# Patient Record
Sex: Female | Born: 2000 | Race: White | Hispanic: No | Marital: Single | Smoking: Never smoker
Health system: Southern US, Community
[De-identification: ages and names within clinical notes are randomized; demographics above are authoritative.]

## PROBLEM LIST (undated history)

## (undated) DIAGNOSIS — F845 Asperger's syndrome: Secondary | ICD-10-CM

---
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
## 2018-03-29 IMAGING — DX DG TIBIA/FIBULA 2V*L*
4 series · 4 of 4 positions shown · non-contrast
Comparison: None.

CLINICAL DATA: Leg pain and swelling

EXAM:
LEFT TIBIA AND FIBULA - 2 VIEW

[tibia ap (1 of 2)]
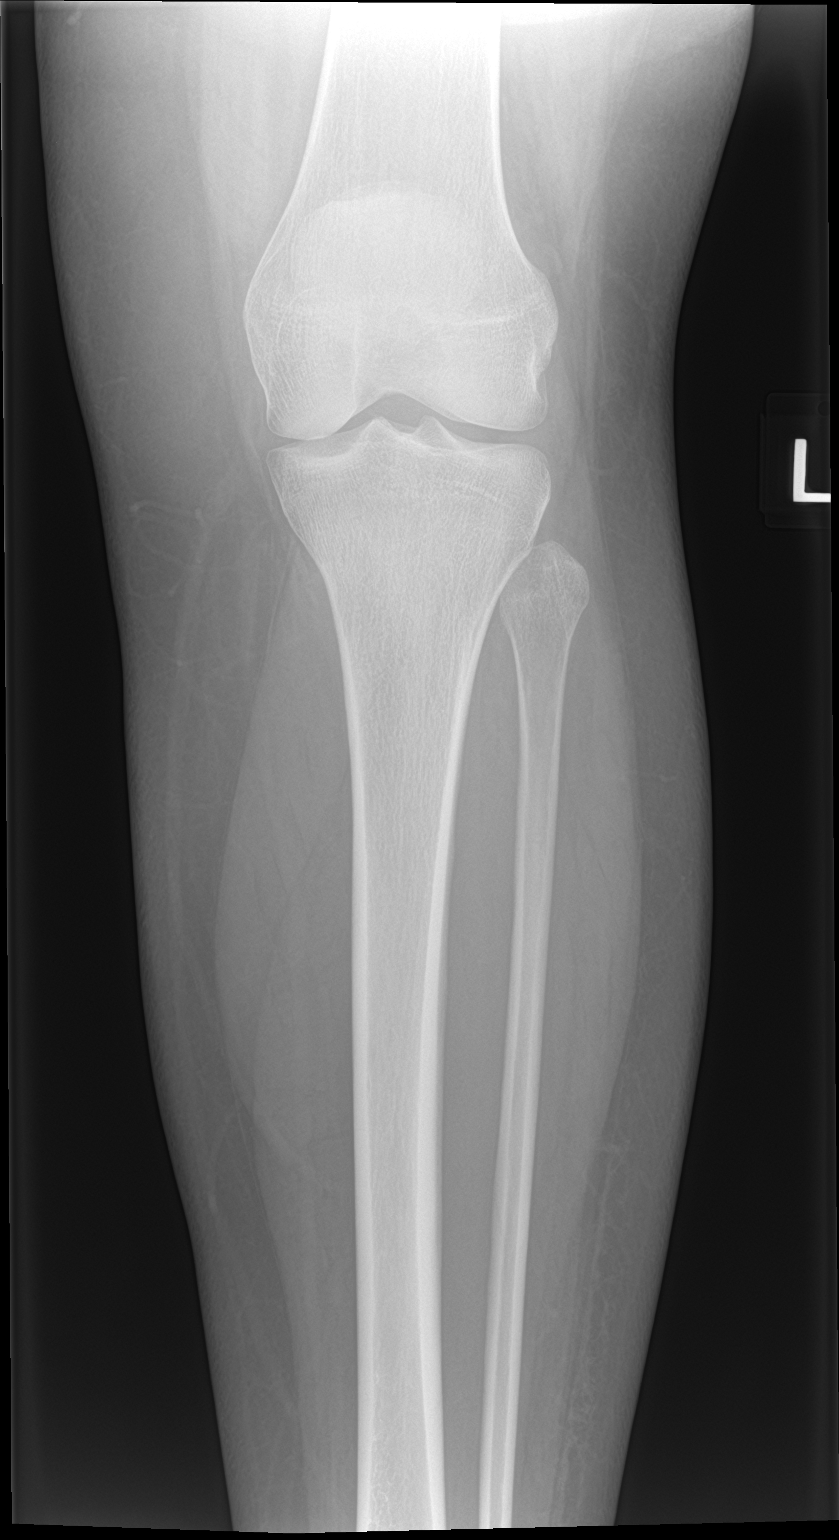

[tibia ap (2 of 2)]
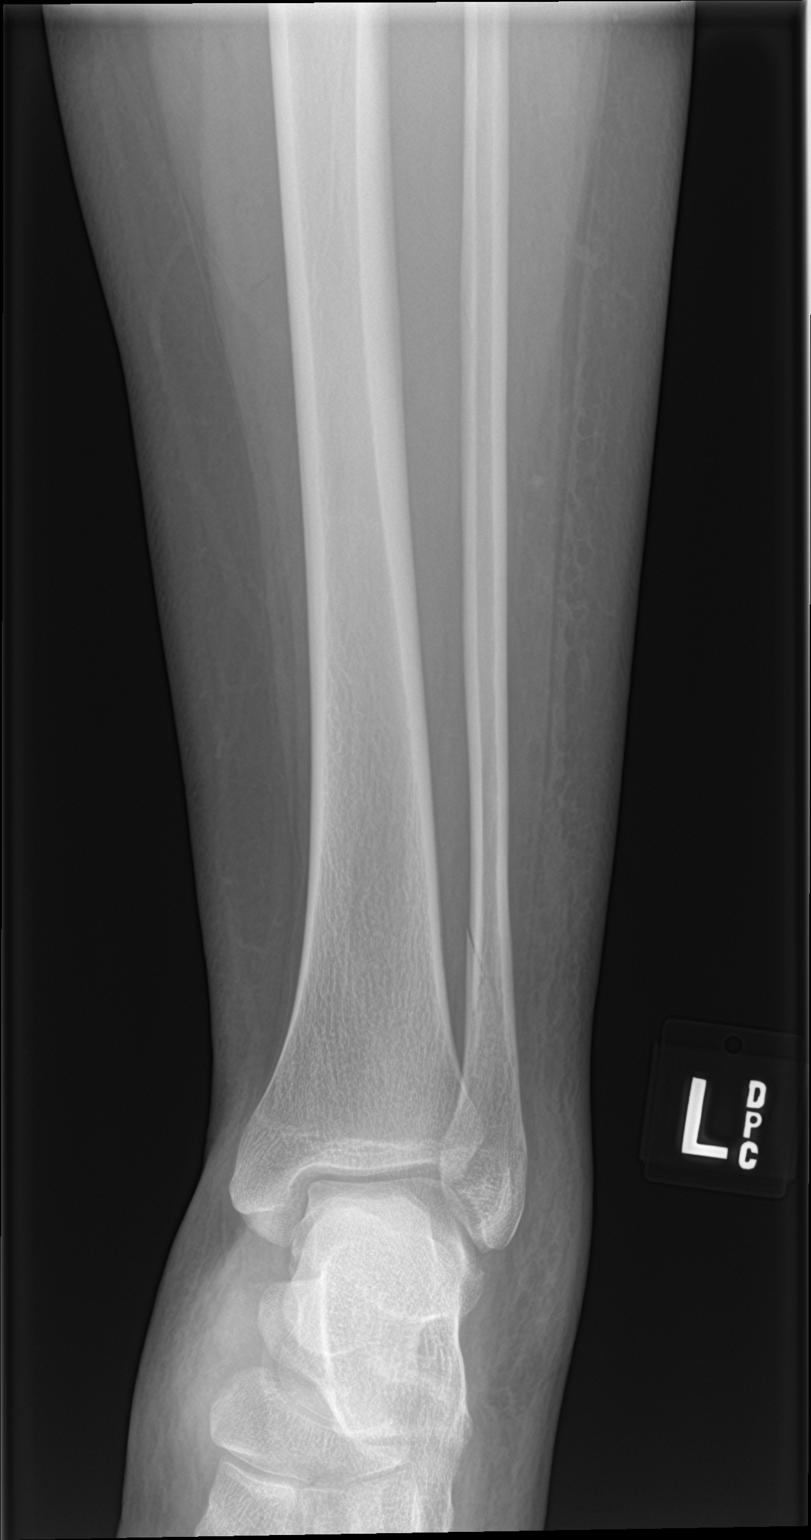

[tibia lat (1 of 2)]
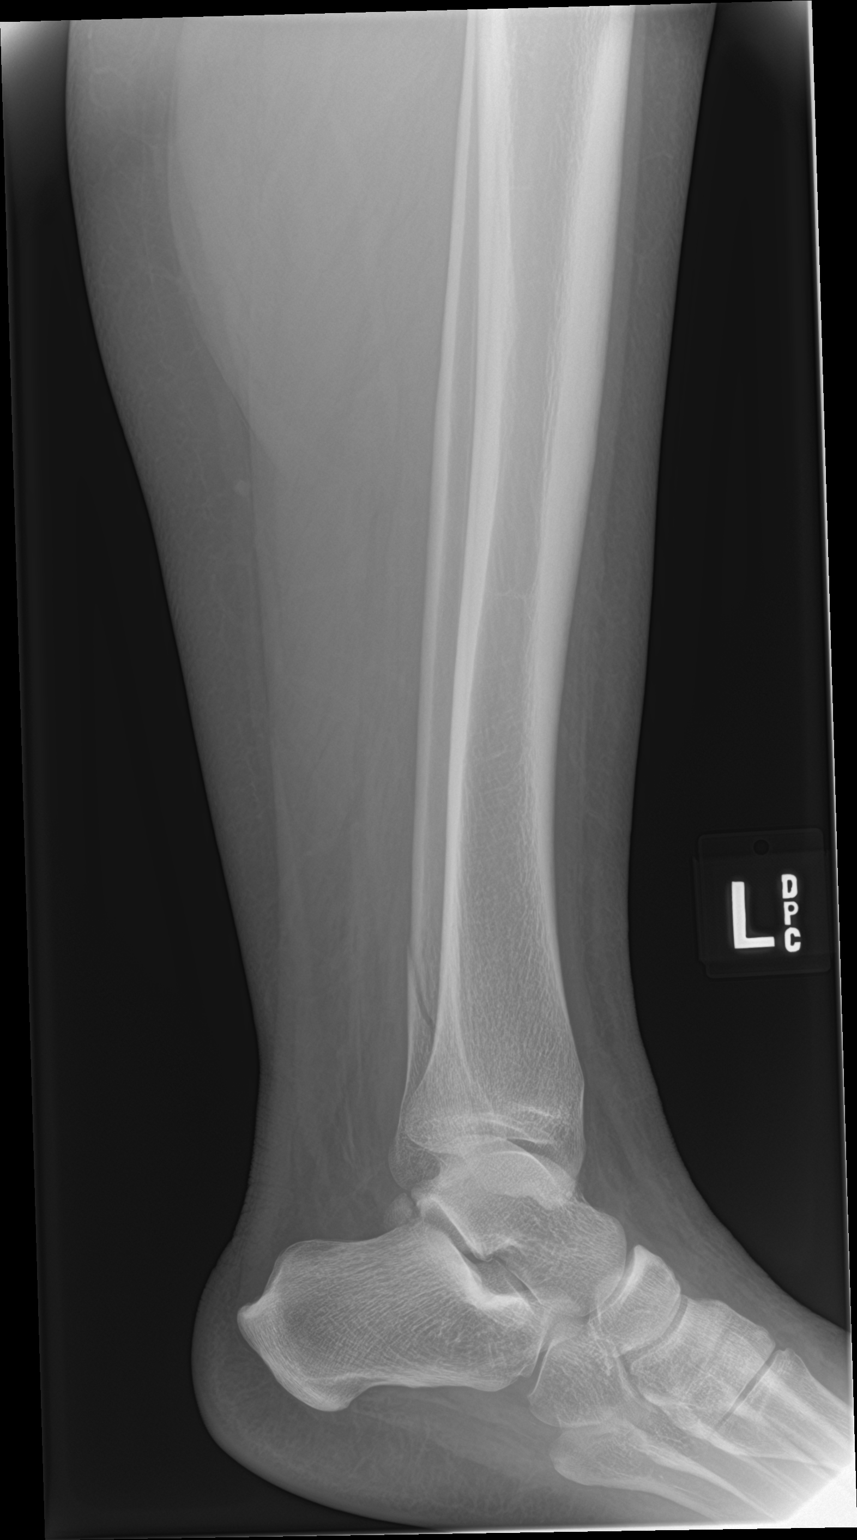

[tibia lat (2 of 2)]
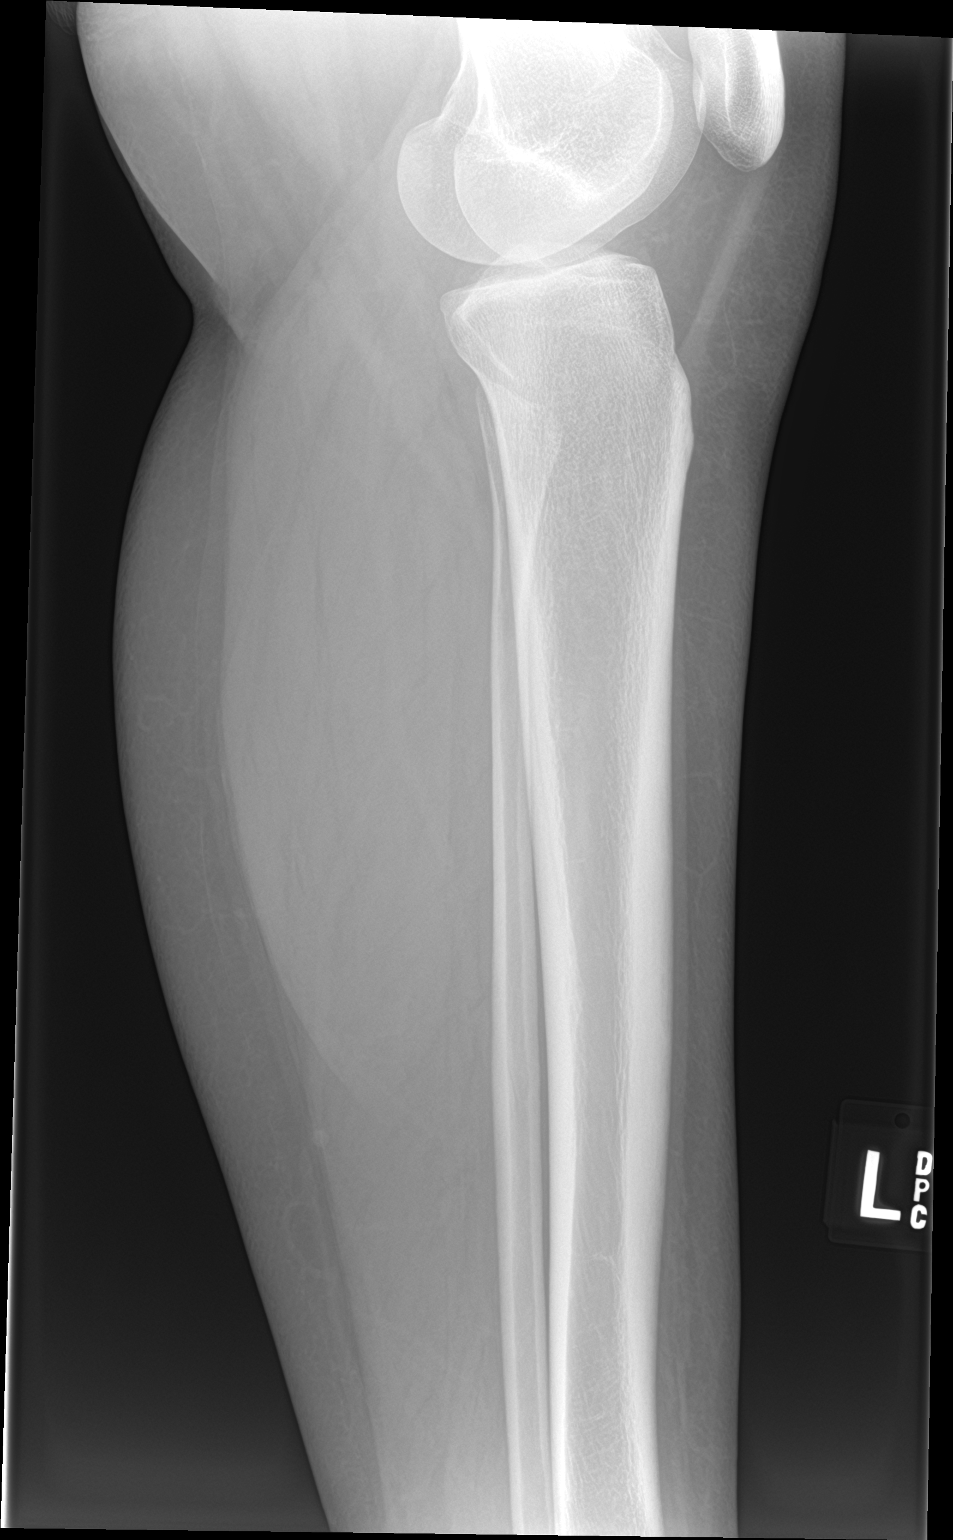

[4 of 4 positions shown; findings below may reference images not displayed]

FINDINGS: Acute nondisplaced fracture through the distal shaft of the fibula,
extending to the fibular malleolus. No significant angulation. No
tibial fracture. Soft tissue swelling lateral distal leg.
IMPRESSION: Acute nondisplaced distal fibular fracture

## 2018-03-29 IMAGING — DX DG ANKLE COMPLETE 3+V*L*
3 series · 3 of 3 positions shown · non-contrast
Comparison: None.

CLINICAL DATA: Injury with pain and swelling

EXAM:
LEFT ANKLE COMPLETE - 3+ VIEW

[ankle ap]
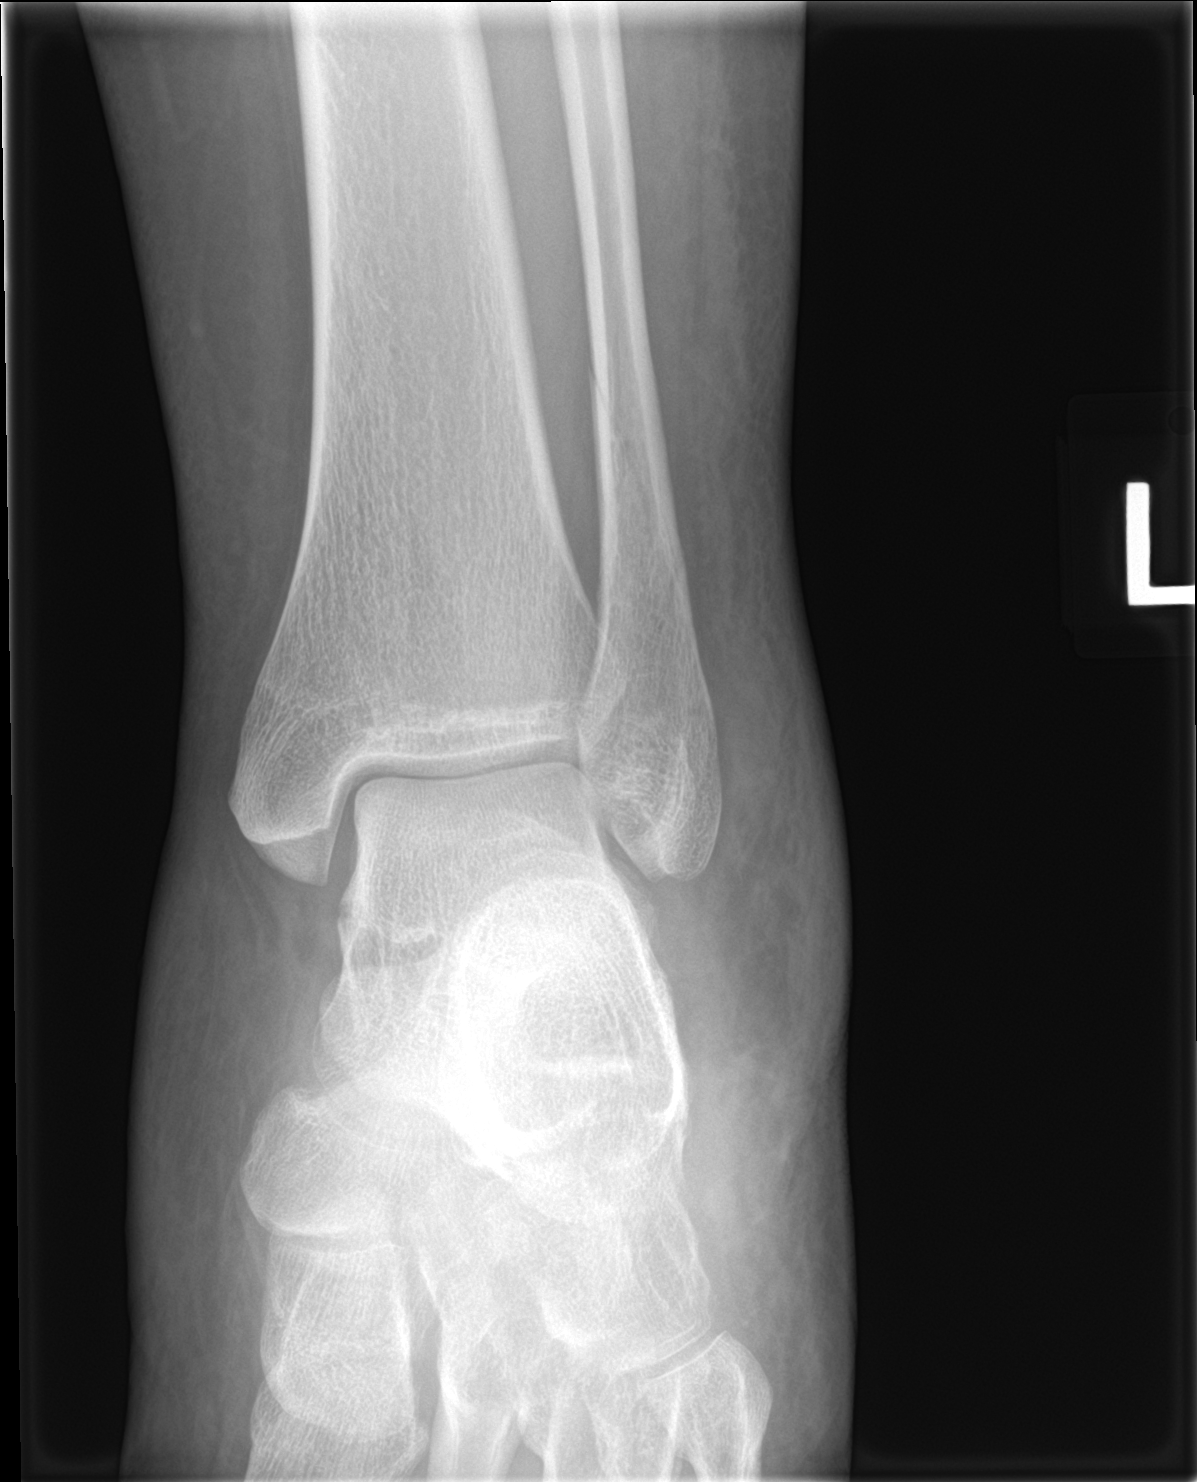

[ankle obl]
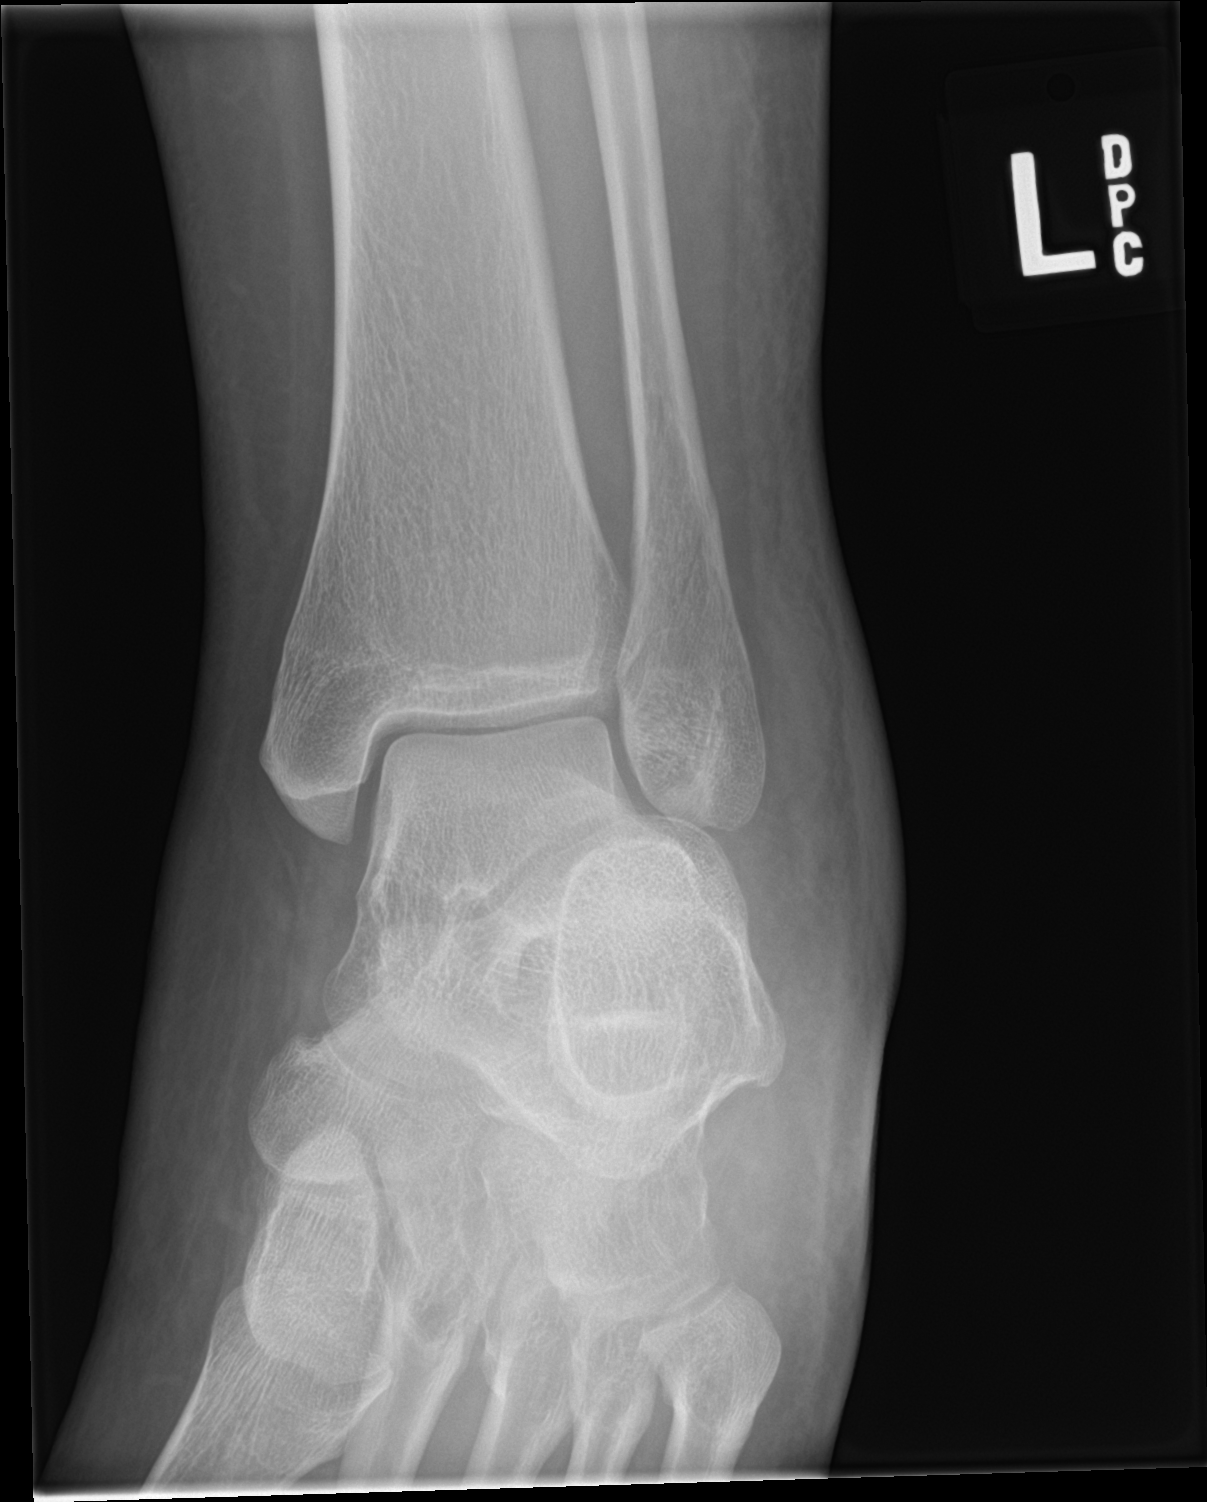

[ankle lat]
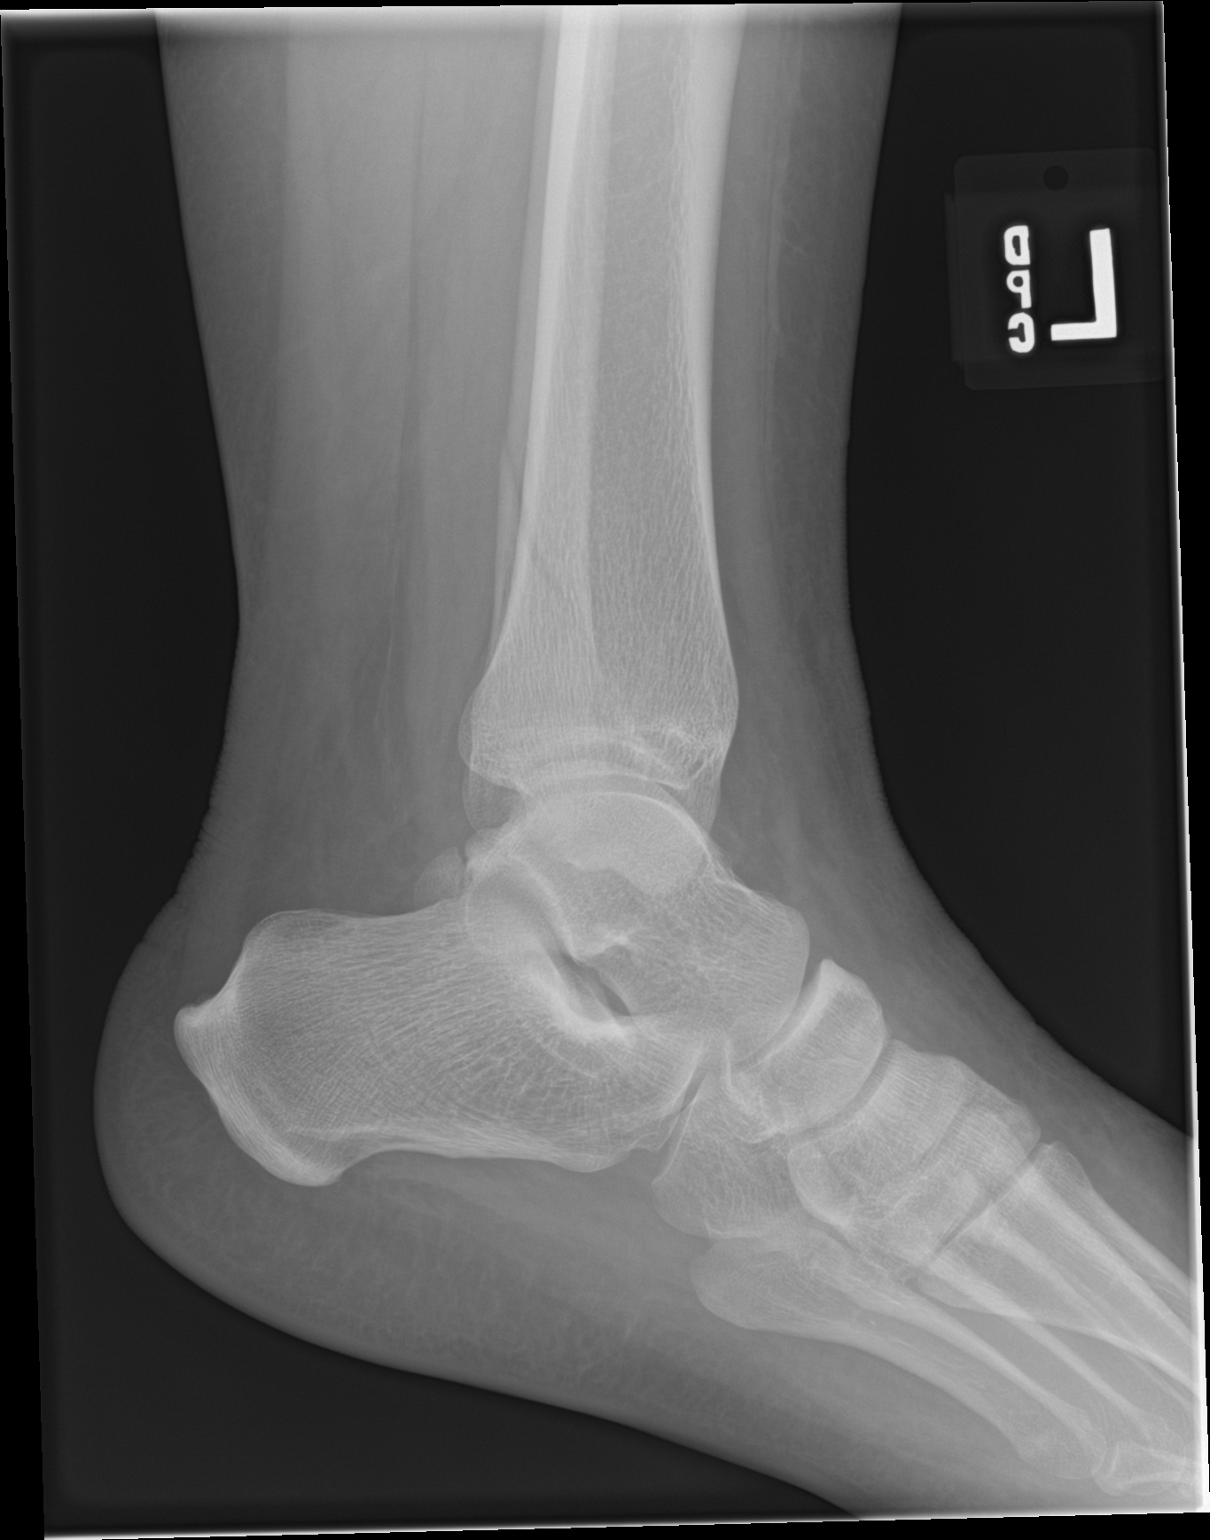

[3 of 3 positions shown; findings below may reference images not displayed]

FINDINGS: Acute nondisplaced fracture involving distal shaft and metaphysis of
the fibula. Ankle mortise is symmetric. Tibia appears intact. Os
trigonum.
IMPRESSION: Acute nondisplaced distal fibular fracture
